# Patient Record
Sex: Female | Born: 2000 | Hispanic: Yes | Marital: Single | Smoking: Never smoker
Health system: Southern US, Community
[De-identification: ages and names within clinical notes are randomized; demographics above are authoritative.]

---
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
## 2021-01-09 DIAGNOSIS — Z1331 Encounter for screening for depression: Secondary | ICD-10-CM | POA: Diagnosis not present

## 2021-01-09 DIAGNOSIS — Z0000 Encounter for general adult medical examination without abnormal findings: Secondary | ICD-10-CM | POA: Diagnosis not present

## 2021-01-09 DIAGNOSIS — Z118 Encounter for screening for other infectious and parasitic diseases: Secondary | ICD-10-CM | POA: Diagnosis not present

## 2021-01-09 DIAGNOSIS — Z6825 Body mass index (BMI) 25.0-25.9, adult: Secondary | ICD-10-CM | POA: Diagnosis not present

## 2021-03-03 ENCOUNTER — Ambulatory Visit
Admission: RE | Admit: 2021-03-03 | Discharge: 2021-03-03 | Disposition: A | Discharge status: Home / Self Care | Payer: BC Managed Care – PPO | Source: Ambulatory Visit | Attending: Sports Medicine | Admitting: Sports Medicine

## 2021-03-03 ENCOUNTER — Other Ambulatory Visit: Payer: Self-pay

## 2021-03-03 ENCOUNTER — Ambulatory Visit (INDEPENDENT_AMBULATORY_CARE_PROVIDER_SITE_OTHER): Payer: BC Managed Care – PPO | Admitting: Sports Medicine

## 2021-03-03 VITALS — Ht <= 58 in | Wt 126.0 lb

## 2021-03-03 DIAGNOSIS — M25572 Pain in left ankle and joints of left foot: Secondary | ICD-10-CM

## 2021-03-03 NOTE — Progress Notes (Signed)
   Subjective:    Patient ID: Mariah Murphy, female    DOB: 02/05/2001, 20 y.o.   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  She is seen today for evaluation of left lateral ankle pain.  She suffered an inversion injury on 01/22/21 while training at home for a physical fitness test.  There was significant swelling and discoloration at the time of the injury that eventually subsided.  She continues to play and is able to run and walk without pain.  Mariah Murphy has been taping her ankle prior to training, practice, or games.  She is working with the Event organiser at [employer] on exercises to strengthen her left ankle.  Currently she experiences lateral ankle pain with planting on her left foot to shoot and with twisting / cutting movements.  She also experiences periodic paresthesias along the dorsum of her left foot.  She describes a baseline level of swelling around the lateral portion of her left ankle that worsens after games.  There is no history of prior injury to her left ankle.  ROS: Negative unless otherwise noted above    Objective:  Exam: Left ankle Inspection: There is mild swelling around the left lateral ankle/distal fibula Palpation: TTP over the distal fibula ROM: Normal dorsiflexion, plantarflexion, inversion, and eversion of the left foot Special testing: Slightly positive anterior drawer, negative talar tilt  [place] L Ankle (03/03/21) There is a small effusion present around the distal fibula and lateral ankle joint Peroneal tendons are intact and without evidence of effusion or injury     Assessment & Plan:   S/p left ankle sprain, rule out distal fibular fracture  Ultrasound today reveals effusion around the distal fibula and lateral ankle joint.  There is also a potential step-off along the contour of the distal fibula, concerning for possible distal fibular fracture.  Her tendons appear structurally intact and without evidence of surrounding  effusion.  Given the persistence of her pain now past one month from the date of injury, in addition to today's ultrasound findings, we discussed obtaining x-rays to rule out a distal fibula fracture.  If a fracture is identified, she would need to stop playing for several weeks to allow the fracture to heal. If xrays are negative she can continue to play, and we discussed trying a compression wrap around her ankle instead of taping. Should her pain persist or worsen and xrays are negative, we discussed that the next option would be to obtain an MRI.   Christel Mormon, MD PGY-3  Patient seen and evaluated with the resident.  I agree with the above plan of care.  X-rays show no obvious fracture.  Please note that the official radiology report was pending at the time of this dictation.  She may continue with treatment in the training room at [facility] where I will follow her progress.  If symptoms do not improve then consider MRI to rule out osteochondral injury of the ankle.

## 2021-10-21 IMAGING — DX DG ANKLE COMPLETE 3+V*L*
3 series · 3 of 3 positions shown · non-contrast
Comparison: None.

CLINICAL DATA: left ankle pain; 3 views

EXAM:
LEFT ANKLE COMPLETE - 3+ VIEW

[dg ankle complete left (1 of 3)]
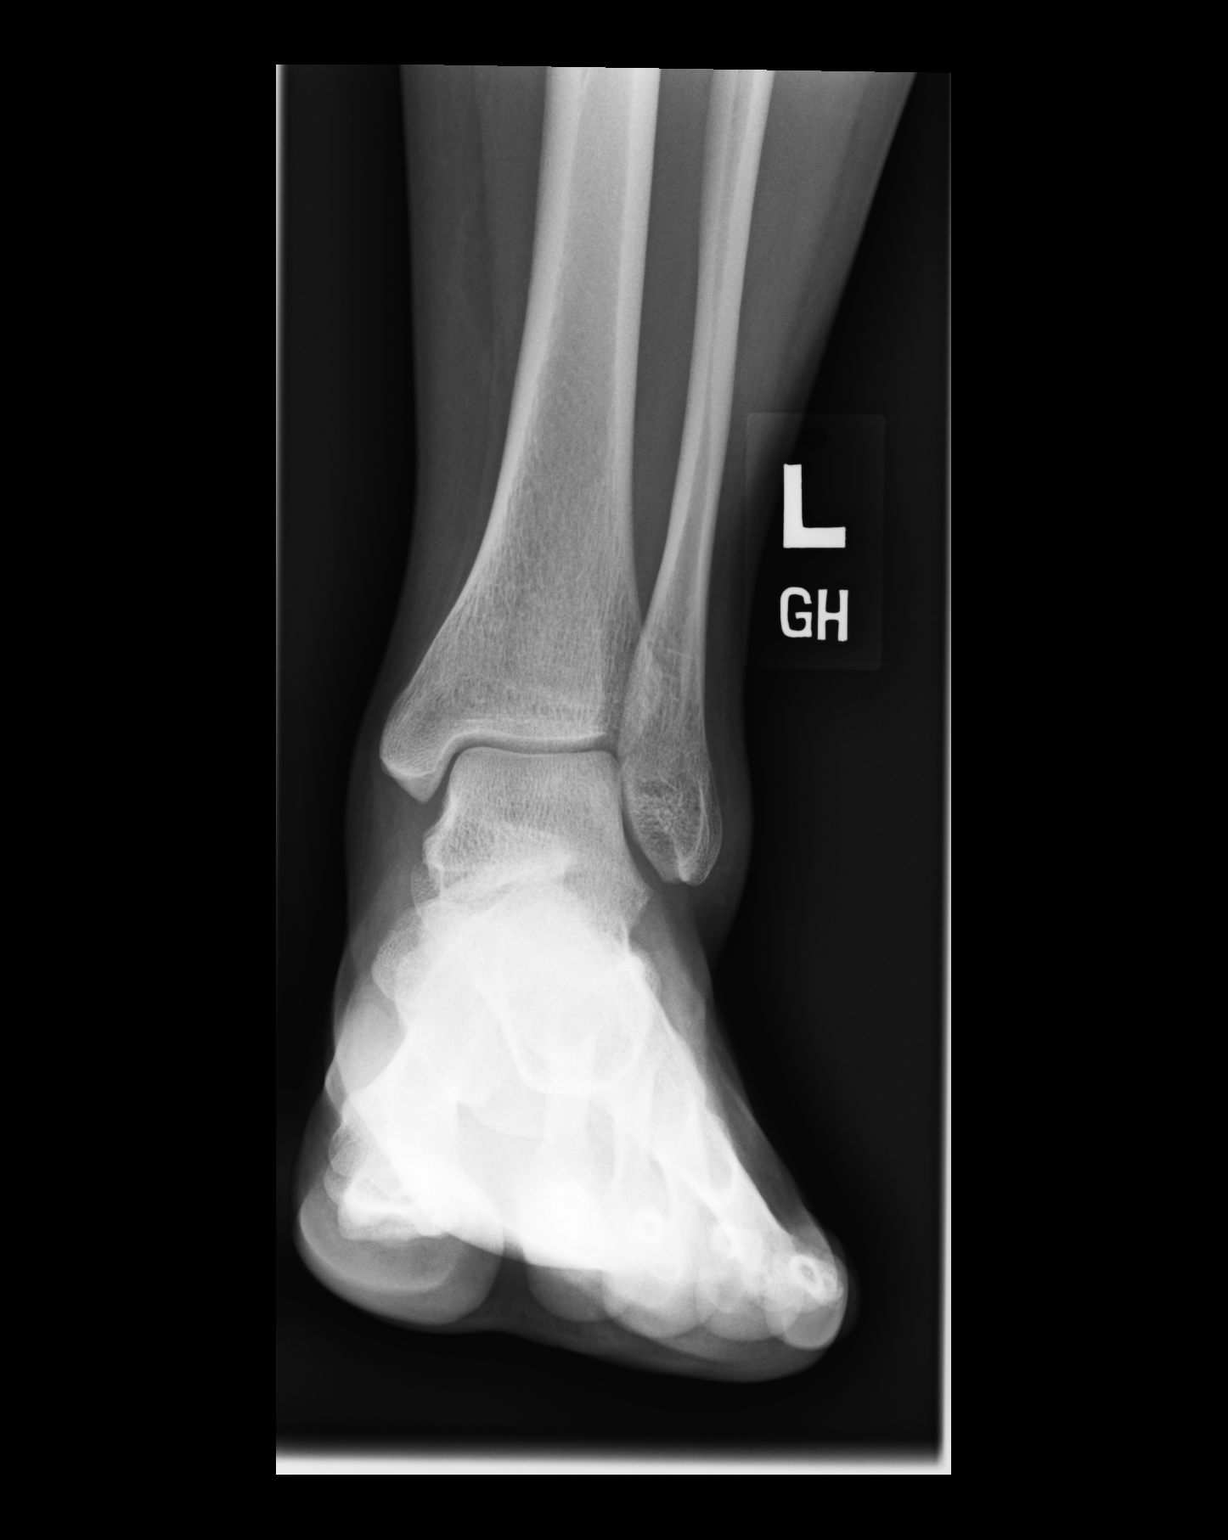

[dg ankle complete left (2 of 3)]
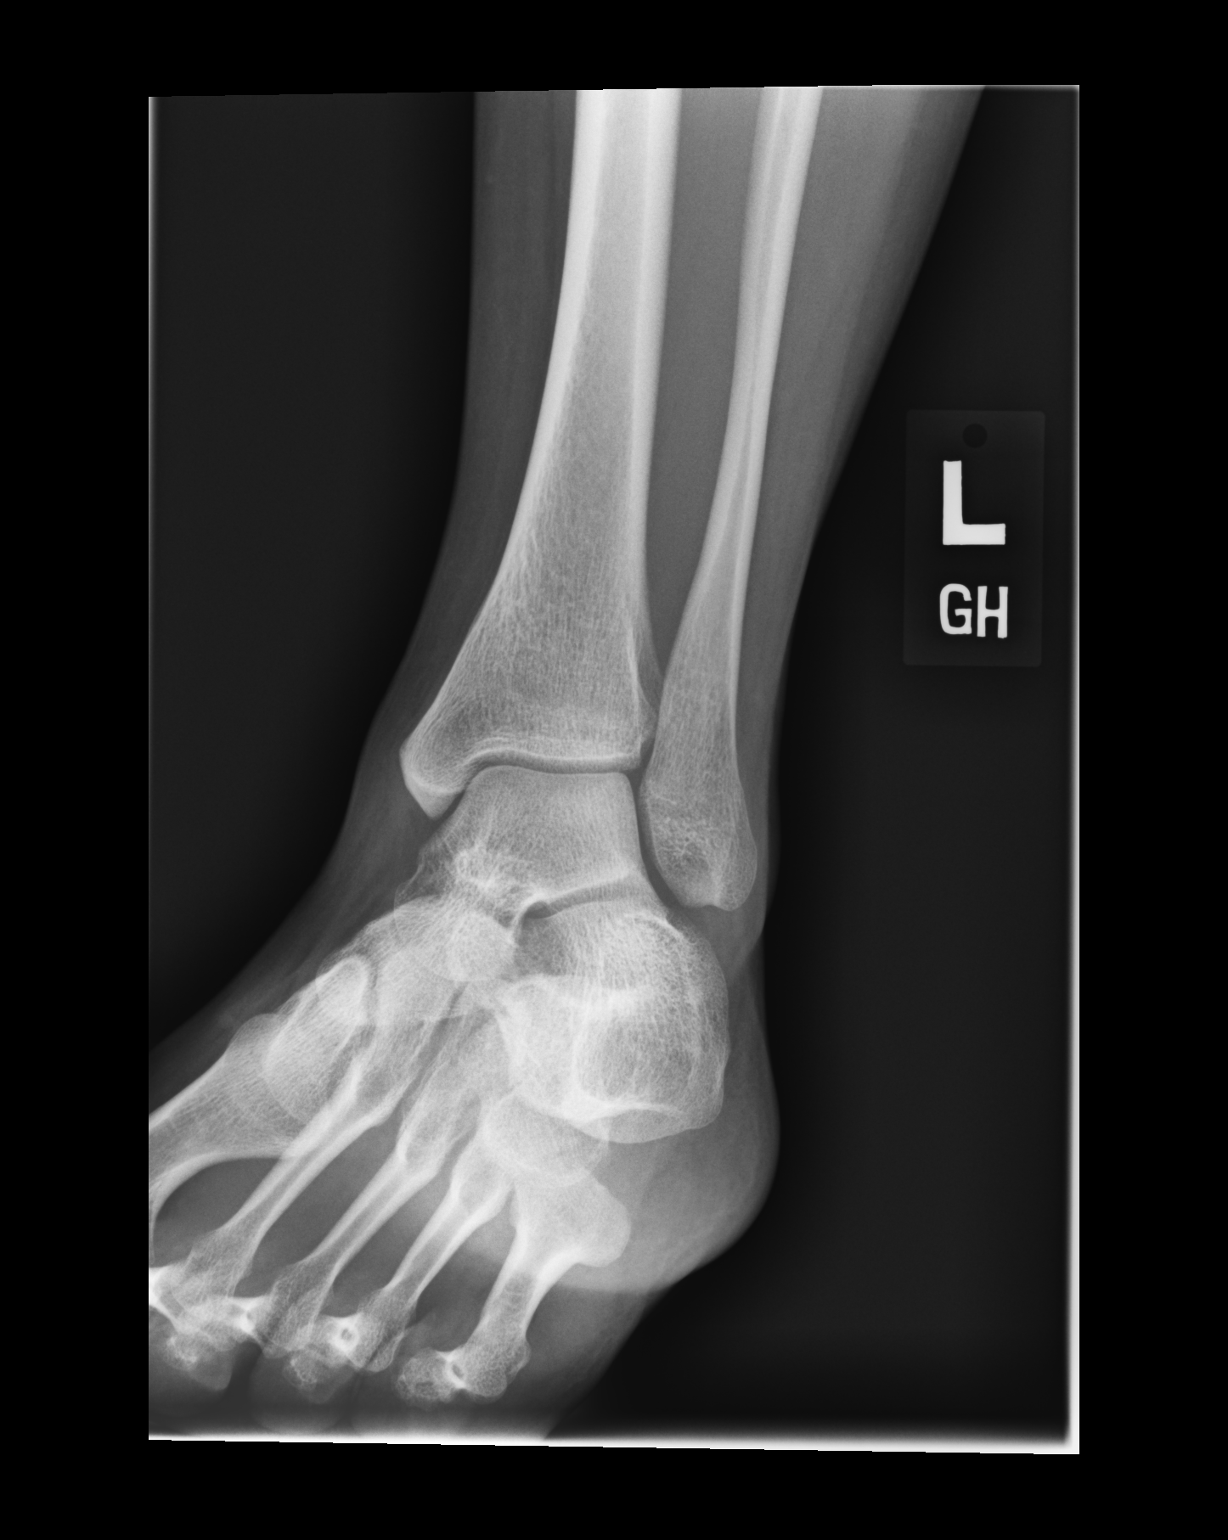

[dg ankle complete left (3 of 3)]
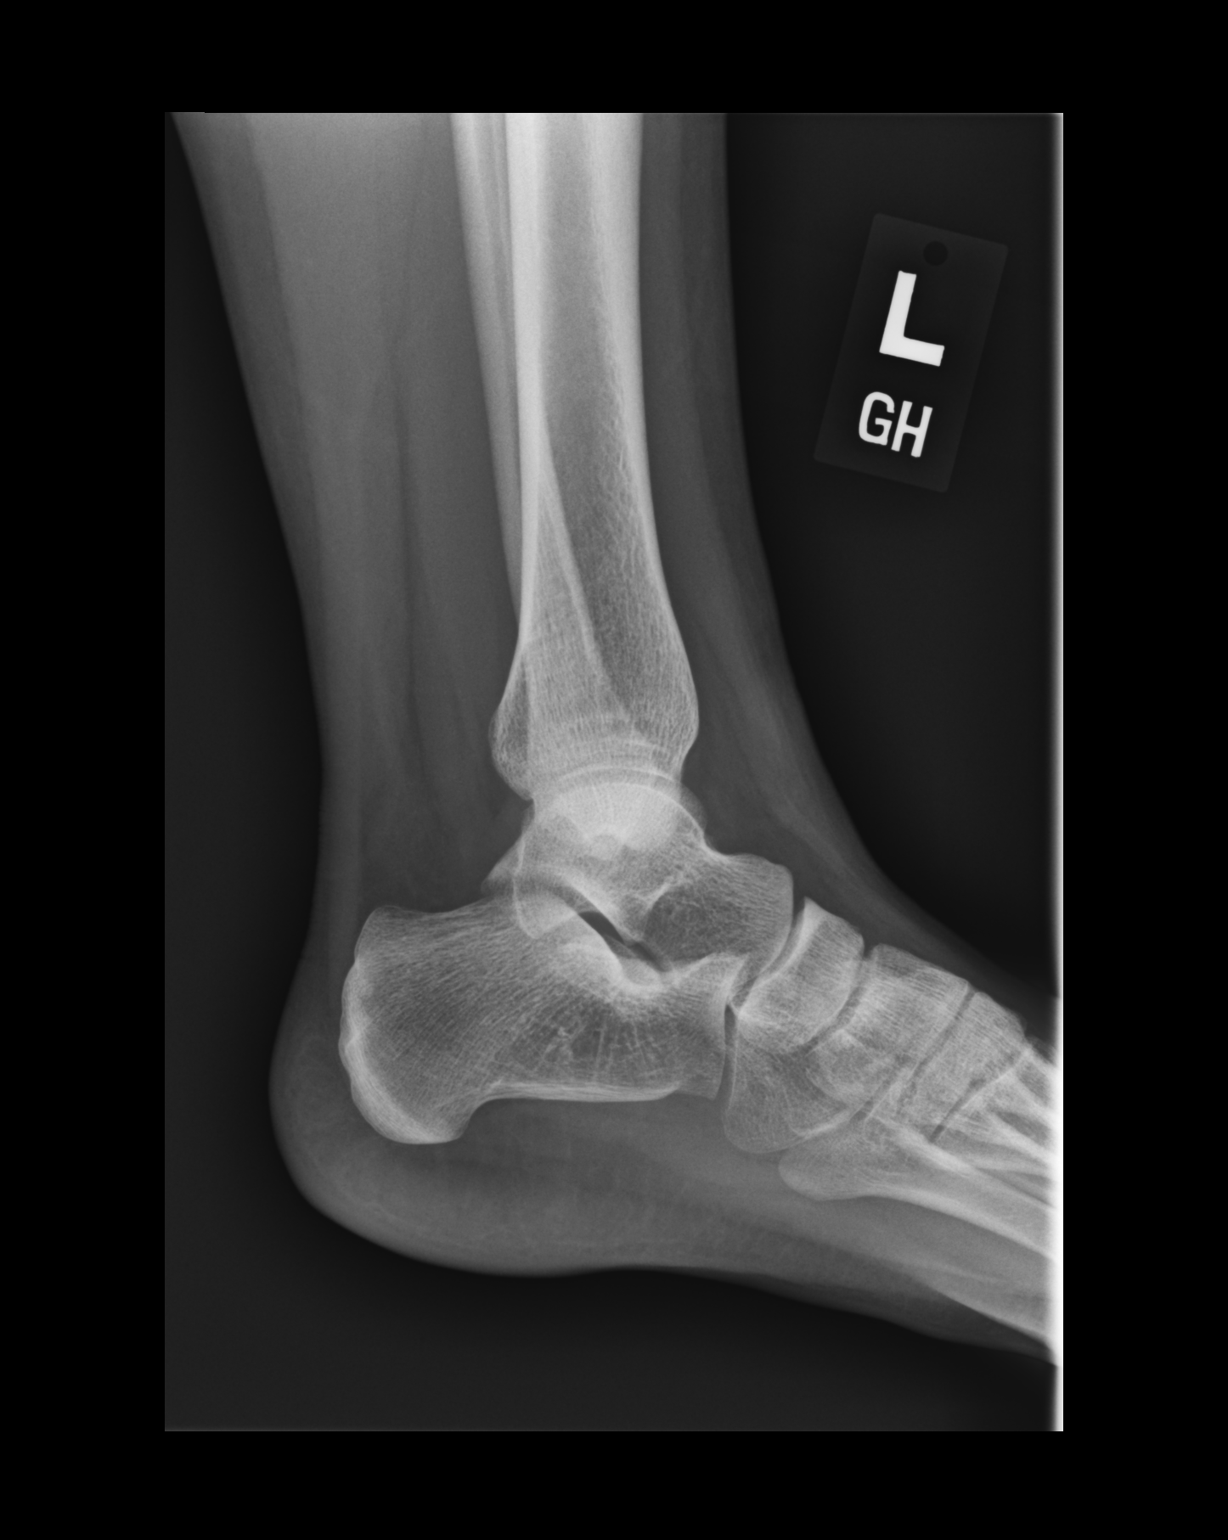

[3 of 3 positions shown; findings below may reference images not displayed]

FINDINGS: There is no evidence of acute fracture. Normal alignment. Possible
mild ankle soft tissue swelling.
IMPRESSION: No acute osseous abnormality.

## 2021-11-24 DIAGNOSIS — Z30431 Encounter for routine checking of intrauterine contraceptive device: Secondary | ICD-10-CM | POA: Diagnosis not present

## 2022-06-18 DIAGNOSIS — N760 Acute vaginitis: Secondary | ICD-10-CM | POA: Diagnosis not present

## 2022-06-18 DIAGNOSIS — N898 Other specified noninflammatory disorders of vagina: Secondary | ICD-10-CM | POA: Diagnosis not present

## 2022-06-25 DIAGNOSIS — R399 Unspecified symptoms and signs involving the genitourinary system: Secondary | ICD-10-CM | POA: Diagnosis not present

## 2022-07-01 DIAGNOSIS — R399 Unspecified symptoms and signs involving the genitourinary system: Secondary | ICD-10-CM | POA: Diagnosis not present

## 2022-07-01 DIAGNOSIS — R10814 Left lower quadrant abdominal tenderness: Secondary | ICD-10-CM | POA: Diagnosis not present

## 2022-11-26 DIAGNOSIS — Z01419 Encounter for gynecological examination (general) (routine) without abnormal findings: Secondary | ICD-10-CM | POA: Diagnosis not present

## 2022-11-26 DIAGNOSIS — Z113 Encounter for screening for infections with a predominantly sexual mode of transmission: Secondary | ICD-10-CM | POA: Diagnosis not present

## 2022-11-26 DIAGNOSIS — Z1151 Encounter for screening for human papillomavirus (HPV): Secondary | ICD-10-CM | POA: Diagnosis not present

## 2022-11-26 DIAGNOSIS — Z6824 Body mass index (BMI) 24.0-24.9, adult: Secondary | ICD-10-CM | POA: Diagnosis not present

## 2024-04-24 ENCOUNTER — Encounter (HOSPITAL_BASED_OUTPATIENT_CLINIC_OR_DEPARTMENT_OTHER): Payer: Self-pay | Admitting: Family Medicine

## 2024-04-24 ENCOUNTER — Ambulatory Visit (INDEPENDENT_AMBULATORY_CARE_PROVIDER_SITE_OTHER): Admitting: Family Medicine

## 2024-04-24 VITALS — BP 108/61 | HR 61 | Ht 58.25 in | Wt 126.0 lb

## 2024-04-24 DIAGNOSIS — Z0000 Encounter for general adult medical examination without abnormal findings: Secondary | ICD-10-CM | POA: Diagnosis not present

## 2024-04-24 DIAGNOSIS — Z7689 Persons encountering health services in other specified circumstances: Secondary | ICD-10-CM

## 2024-04-24 DIAGNOSIS — Z975 Presence of (intrauterine) contraceptive device: Secondary | ICD-10-CM

## 2024-04-24 NOTE — Progress Notes (Signed)
 Subjective:   Mariah Murphy 2001/05/04  04/24/2024   CC: Chief Complaint  Patient presents with   New Patient (Initial Visit)    Patient is here today to get established with the practice. Denies any main concerns for today's visit.    HPI: Mariah Murphy is a 23 y.o. female who presents to establish care and for a routine health maintenance exam.     IUD MAINTENANCE:  Patient states she had her IUD placed in July 2021 by OBGYN in [place], [place]. Will obtain records from [facility] for placement and pap smear per patient. She states she will occasionally get UTI and vaginal yeast infections due to positioning of IUD and patient's height/body mass per OBGYN.    HEALTH SCREENINGS: - Vision Screening: not applicable - Dental Visits: up to date - Pap smear: Completed with Bridget[facility] SHIPPER- will obtain records - Breast Exam: Declined - STD Screening: Declined - Mammogram (40+): Not applicable  - Colonoscopy (45+): Not applicable  - Bone Density (65+ or under 65 with predisposing conditions): Not applicable  - Lung CA screening with low-dose CT:  Not applicable Adults age 11-80 who are current cigarette smokers or quit within the last 15 years. Must have 20 pack year history.   Depression and Anxiety Screen done today and results listed below:     04/24/2024    3:26 PM  Depression screen PHQ 2/9  Decreased Interest 0  Down, Depressed, Hopeless 0  PHQ - 2 Score 0  Altered sleeping 0  Tired, decreased energy 0  Change in appetite 0  Feeling bad or failure about yourself  0  Trouble concentrating 0  Moving slowly or fidgety/restless 0  Suicidal thoughts 0  PHQ-9 Score 0  Difficult doing work/chores Not difficult at all      04/24/2024    3:26 PM  GAD 7 : Generalized Anxiety Score  Nervous, Anxious, on Edge 0  Control/stop worrying 0  Worry too much - different things 0  Trouble relaxing 0  Restless 0  Easily annoyed or irritable 0  Afraid - awful might  happen 0  Total GAD 7 Score 0  Anxiety Difficulty Not difficult at all    IMMUNIZATIONS: - Tdap: Tetanus vaccination status reviewed: Recommended, patient declined. - HPV: Up to date - Influenza: Refused - Pneumovax: Not applicable - Prevnar 20: Not applicable - Shingrix (50+): Not applicable   Past medical history, surgical history, medications, allergies, family history and social history reviewed with patient today and changes made to appropriate areas of the chart.   History reviewed. No pertinent past medical history.  History reviewed. No pertinent surgical history.  Current Outpatient Medications on File Prior to Visit  Medication Sig   levonorgestrel (KYLEENA) 19.5 MG IUD 1 each by Intrauterine route once. Placed on July 30th, 2021 per patietn with OBGYN   No current facility-administered medications on file prior to visit.    No Known Allergies   Social History   Socioeconomic History   Marital status: Single    Spouse name: Not on file   Number of children: Not on file   Years of education: Not on file   Highest education level: Not on file  Occupational History   Not on file  Tobacco Use   Smoking status: Never   Smokeless tobacco: Never  Vaping Use   Vaping status: Never Used  Substance and Sexual Activity   Alcohol use: Never   Drug use: Never   Sexual activity: Yes  Birth control/protection: I.U.D.  Other Topics Concern   Not on file  Social History Narrative   Not on file   Social Drivers of Health   Financial Resource Strain: Not on file  Food Insecurity: Not on file  Transportation Needs: Not on file  Physical Activity: Not on file  Stress: Not on file  Social Connections: Not on file  Intimate Partner Violence: Not on file   Social History   Tobacco Use  Smoking Status Never  Smokeless Tobacco Never   Social History   Substance and Sexual Activity  Alcohol Use Never    Family History  Adopted: Yes     ROS: Denies  fever, fatigue, unexplained weight loss/gain, chest pain, SHOB, and palpitations. Denies neurological deficits, gastrointestinal or genitourinary complaints, and skin changes.   Objective:   Today's Vitals   04/24/24 1522  BP: 108/61  Pulse: 61  SpO2: 100%  Weight: 126 lb (57.2 kg)  Height: 4' 10.25 (1.48 m)    GENERAL APPEARANCE: Well-appearing, in NAD. Well nourished.  SKIN: Pink, warm and dry. Turgor normal. No rash, lesion, ulceration, or ecchymoses. Hair evenly distributed.  HEENT: HEAD: Normocephalic.  EYES: PERRLA. EOMI. Lids intact w/o defect. Sclera white, Conjunctiva pink w/o exudate.  EARS: External ear w/o redness, swelling, masses or lesions. EAC clear. TM's intact, translucent w/o bulging, appropriate landmarks visualized. Appropriate acuity to conversational tones.  NOSE: Septum midline w/o deformity. Nares patent, mucosa pink and non-inflamed w/o drainage. No sinus tenderness.  THROAT: Uvula midline. Oropharynx clear. Tonsils non-inflamed w/o exudate. Oral mucosa pink and moist.  NECK: Supple, Trachea midline. Full ROM w/o pain or tenderness. No lymphadenopathy. Thyroid non-tender w/o enlargement or palpable masses.  RESPIRATORY: Chest wall symmetrical w/o masses. Respirations even and non-labored. Breath sounds clear to auscultation bilaterally. No wheezes, rales, rhonchi, or crackles. CARDIAC: S1, S2 present, regular rate and rhythm. No gallops, murmurs, rubs, or clicks. PMI w/o lifts, heaves, or thrills. No carotid bruits. Capillary refill <2 seconds. Peripheral pulses 2+ bilaterally. GI: Abdomen soft w/o distention. Normoactive bowel sounds. No palpable masses or tenderness. No guarding or rebound tenderness. Liver and spleen w/o tenderness or enlargement. No CVA tenderness.  MSK: Muscle tone and strength appropriate for age, w/o atrophy or abnormal movement.  EXTREMITIES: Active ROM intact, w/o tenderness, crepitus, or contracture. No obvious joint deformities or  effusions. No clubbing, edema, or cyanosis.  NEUROLOGIC: CN's II-XII intact. Motor strength symmetrical with no obvious weakness. No sensory deficits. DTR's 2+ symmetric bilaterally. Steady, even gait.  PSYCH/MENTAL STATUS: Alert, oriented x 3. Cooperative, appropriate mood and affect.    No results found for this or any previous visit.  Assessment & Plan:  1. Encounter to establish care with new doctor (Primary) Discussed role of PCP and reviewed medical history.   2. Annual physical exam Discussed preventative screenings, vaccines, and healthy lifestyle with patient. Patient will return for fasting labs when able. Recommend Flu and Tdap vaccines for patient.  - CBC with Differential/Platelet; Future - Comprehensive metabolic panel with GFR; Future - Lipid panel; Future  3. IUD (intrauterine device) in place Discussed IUD in place and will need to be replaced by OBGYN when due. Patient aware and will obtain OBGYN. Declined referral need today.    Orders Placed This Encounter  Procedures   CBC with Differential/Platelet    Standing Status:   Future    Expiration Date:   07/29/2024   Comprehensive metabolic panel with GFR    Standing Status:   Future  Expiration Date:   07/29/2024   Lipid panel    Standing Status:   Future    Expiration Date:   07/29/2024    PATIENT COUNSELING:  - Encouraged a healthy well-balanced diet. Patient may adjust caloric intake to maintain or achieve ideal body weight. May reduce intake of dietary saturated fat and total fat and have adequate dietary potassium and calcium preferably from fresh fruits, vegetables, and low-fat dairy products.   - Advised to avoid cigarette smoking. - Discussed with the patient that most people either abstain from alcohol or drink within safe limits (<=14/week and <=4 drinks/occasion for males, <=7/weeks and <= 3 drinks/occasion for females) and that the risk for alcohol disorders and other health effects rises proportionally  with the number of drinks per week and how often a drinker exceeds daily limits. - Discussed cessation/primary prevention of drug use and availability of treatment for abuse.  - Discussed sexually transmitted diseases, avoidance of unintended pregnancy and contraceptive alternatives.  - Stressed the importance of regular exercise - Injury prevention: Discussed safety belts, safety helmets, smoke detector, smoking near bedding or upholstery.  - Dental health: Discussed importance of regular tooth brushing, flossing, and dental visits.   NEXT PREVENTATIVE PHYSICAL DUE IN 1 YEAR.  Return in about 1 year (around 04/24/2025) for ANNUAL PHYSICAL.  Patient to reach out to office if new, worrisome, or unresolved symptoms arise or if no improvement in patient's condition. Patient verbalized understanding and is agreeable to treatment plan. All questions answered to patient's satisfaction.    Thersia Schuyler Stark, [place]

## 2024-04-24 NOTE — Patient Instructions (Addendum)
 Please return for fasting blood work.  For fasting, if your blood work is in the morning please do not eat any food after midnight.  You may have water or black coffee prior to your lab work.  Please take all regularly prescribed medications even if you are fasting.  If your blood work is in the afternoon, please fast for at least 5 to 6 hours.  You may continue to drink water and/or black coffee prior to your lab work.  Please take all scheduled medications even if you are fasting.   Things to do to keep yourself healthy: - Exercise at least 30-45 minutes a day, 3-4 days a week.  - Eat a low-fat diet with lots of fruits and vegetables, up to 7-9 servings per day.  - Seatbelts can save your life. Wear them always.  - Smoke detectors on every level of your home, check batteries every year.  - Eye Doctor - have an eye exam every 1-2 years  - Safe sex - if you may be exposed to STDs, use a condom.  - No smoking, vaping, or use of any tobacco products.  - Alcohol  -  If you drink, do it moderately, less than 2 drinks per day.  - No illegal drug use.  - Depression is common in our stressful world.If you're feeling down or losing interest in things you normally enjoy, please come in for a visit.  - Violence - If anyone is threatening or hurting you, please call immediately.

## 2024-05-16 ENCOUNTER — Encounter (HOSPITAL_BASED_OUTPATIENT_CLINIC_OR_DEPARTMENT_OTHER): Payer: Self-pay | Admitting: Family Medicine

## 2024-05-17 NOTE — Telephone Encounter (Signed)
 Please see mychart message sent by pt and advise.

## 2024-05-21 ENCOUNTER — Other Ambulatory Visit (HOSPITAL_BASED_OUTPATIENT_CLINIC_OR_DEPARTMENT_OTHER): Payer: Self-pay | Admitting: Family Medicine

## 2024-05-21 DIAGNOSIS — R61 Generalized hyperhidrosis: Secondary | ICD-10-CM

## 2024-05-28 ENCOUNTER — Other Ambulatory Visit (HOSPITAL_BASED_OUTPATIENT_CLINIC_OR_DEPARTMENT_OTHER): Payer: Self-pay | Admitting: Family Medicine

## 2024-05-28 DIAGNOSIS — Z0000 Encounter for general adult medical examination without abnormal findings: Secondary | ICD-10-CM

## 2024-05-28 DIAGNOSIS — R61 Generalized hyperhidrosis: Secondary | ICD-10-CM

## 2024-05-29 LAB — COMPREHENSIVE METABOLIC PANEL WITH GFR
ALT: 13 IU/L (ref 0–32)
AST: 19 IU/L (ref 0–40)
Albumin: 5.2 g/dL — ABNORMAL HIGH (ref 4.0–5.0)
Alkaline Phosphatase: 83 IU/L (ref 41–116)
BUN/Creatinine Ratio: 14 (ref 9–23)
BUN: 12 mg/dL (ref 6–20)
Bilirubin Total: 0.7 mg/dL (ref 0.0–1.2)
CO2: 21 mmol/L (ref 20–29)
Calcium: 10.6 mg/dL — ABNORMAL HIGH (ref 8.7–10.2)
Chloride: 102 mmol/L (ref 96–106)
Creatinine, Ser: 0.88 mg/dL (ref 0.57–1.00)
Globulin, Total: 2.5 g/dL (ref 1.5–4.5)
Glucose: 86 mg/dL (ref 70–99)
Potassium: 5.3 mmol/L — ABNORMAL HIGH (ref 3.5–5.2)
Sodium: 140 mmol/L (ref 134–144)
Total Protein: 7.7 g/dL (ref 6.0–8.5)
eGFR: 95 mL/min/1.73 (ref >59)

## 2024-05-29 LAB — TSH RFX ON ABNORMAL TO FREE T4: TSH: 1.95 u[IU]/mL (ref 0.450–4.500)

## 2024-05-29 LAB — CBC WITH DIFFERENTIAL/PLATELET
Basophils Absolute: 0.1 x10E3/uL (ref 0.0–0.2)
Basos: 1 %
EOS (ABSOLUTE): 0 x10E3/uL (ref 0.0–0.4)
Eos: 0 %
Hematocrit: 42.6 % (ref 34.0–46.6)
Hemoglobin: 13.9 g/dL (ref 11.1–15.9)
Immature Grans (Abs): 0 x10E3/uL (ref 0.0–0.1)
Immature Granulocytes: 0 %
Lymphocytes Absolute: 2.4 x10E3/uL (ref 0.7–3.1)
Lymphs: 28 %
MCH: 29.1 pg (ref 26.6–33.0)
MCHC: 32.6 g/dL (ref 31.5–35.7)
MCV: 89 fL (ref 79–97)
Monocytes Absolute: 0.4 x10E3/uL (ref 0.1–0.9)
Monocytes: 5 %
Neutrophils Absolute: 5.6 x10E3/uL (ref 1.4–7.0)
Neutrophils: 66 %
Platelets: 358 x10E3/uL (ref 150–450)
RBC: 4.78 x10E6/uL (ref 3.77–5.28)
RDW: 12 % (ref 11.7–15.4)
WBC: 8.6 x10E3/uL (ref 3.4–10.8)

## 2024-05-29 LAB — LIPID PANEL
Chol/HDL Ratio: 2.6 ratio (ref 0.0–4.4)
Cholesterol, Total: 144 mg/dL (ref 100–199)
HDL: 56 mg/dL (ref >39)
LDL Chol Calc (NIH): 75 mg/dL (ref 0–99)
Triglycerides: 61 mg/dL (ref 0–149)
VLDL Cholesterol Cal: 13 mg/dL (ref 5–40)

## 2024-05-31 ENCOUNTER — Ambulatory Visit (HOSPITAL_BASED_OUTPATIENT_CLINIC_OR_DEPARTMENT_OTHER): Payer: Self-pay | Admitting: Family Medicine

## 2024-05-31 NOTE — Progress Notes (Signed)
 Mariah Murphy,  Your potassium and calcium were slightly elevated.  I would like you to increase your clear fluid intake and we can recheck this in 2 to 3 weeks.  Your thyroid function, lipid panel and blood counts are normal.  If you are agreeable to the recheck please let us  know.

## 2024-06-06 NOTE — Telephone Encounter (Signed)
 Please see mychart message sent by pt and advise.

## 2024-06-07 ENCOUNTER — Other Ambulatory Visit (HOSPITAL_BASED_OUTPATIENT_CLINIC_OR_DEPARTMENT_OTHER): Payer: Self-pay | Admitting: Family Medicine

## 2024-06-07 DIAGNOSIS — E875 Hyperkalemia: Secondary | ICD-10-CM

## 2024-06-07 NOTE — Telephone Encounter (Unsigned)
 Copied from CRM #[id number]. Topic: General - Call Back - No Documentation >> Jun 07, 2024  4:02 PM Joesph B wrote: Reason for CRM: patient states she received a call from a nurse.

## 2024-06-07 NOTE — Telephone Encounter (Signed)
 Can we try to get pt scheduled for a lab appt in 1-2 weeks for repeat BMP?

## 2024-06-11 ENCOUNTER — Encounter (HOSPITAL_BASED_OUTPATIENT_CLINIC_OR_DEPARTMENT_OTHER): Payer: Self-pay

## 2024-06-11 ENCOUNTER — Ambulatory Visit (HOSPITAL_BASED_OUTPATIENT_CLINIC_OR_DEPARTMENT_OTHER)

## 2024-06-11 ENCOUNTER — Other Ambulatory Visit (HOSPITAL_BASED_OUTPATIENT_CLINIC_OR_DEPARTMENT_OTHER): Payer: Self-pay | Admitting: Family Medicine

## 2024-06-12 ENCOUNTER — Ambulatory Visit (HOSPITAL_BASED_OUTPATIENT_CLINIC_OR_DEPARTMENT_OTHER): Payer: Self-pay | Admitting: Family Medicine

## 2024-06-12 LAB — BASIC METABOLIC PANEL WITH GFR
BUN/Creatinine Ratio: 12 (ref 9–23)
BUN: 8 mg/dL (ref 6–20)
CO2: 20 mmol/L (ref 20–29)
Calcium: 9.9 mg/dL (ref 8.7–10.2)
Chloride: 104 mmol/L (ref 96–106)
Creatinine, Ser: 0.69 mg/dL (ref 0.57–1.00)
Glucose: 76 mg/dL (ref 70–99)
Potassium: 5.1 mmol/L (ref 3.5–5.2)
Sodium: 143 mmol/L (ref 134–144)
eGFR: 126 mL/min/1.73 (ref >59)

## 2024-06-12 NOTE — Progress Notes (Signed)
 Labs are within normal limits. Please continue good hydration daily.

## 2024-07-25 ENCOUNTER — Ambulatory Visit (HOSPITAL_BASED_OUTPATIENT_CLINIC_OR_DEPARTMENT_OTHER): Admitting: Family Medicine

## 2024-07-25 ENCOUNTER — Encounter (HOSPITAL_BASED_OUTPATIENT_CLINIC_OR_DEPARTMENT_OTHER): Payer: Self-pay | Admitting: Family Medicine

## 2024-07-25 VITALS — BP 122/81 | HR 85 | Ht 58.25 in | Wt 119.7 lb

## 2024-07-25 DIAGNOSIS — N3000 Acute cystitis without hematuria: Secondary | ICD-10-CM

## 2024-07-25 LAB — POCT URINALYSIS DIPSTICK (MANUAL)
Nitrite, UA: NEGATIVE
Poct Bilirubin: NEGATIVE
Poct Blood: NEGATIVE
Poct Glucose: NORMAL mg/dL
Poct Ketones: NEGATIVE
Poct Protein: NEGATIVE mg/dL
Poct Urobilinogen: NORMAL mg/dL
Spec Grav, UA: 1.01
pH, UA: 7

## 2024-07-25 MED ORDER — NITROFURANTOIN MONOHYD MACRO 100 MG PO CAPS: 100.0000 mg | ORAL_CAPSULE | Freq: Two times a day (BID) | ORAL | 0 refills | Status: AC

## 2024-07-25 NOTE — Progress Notes (Signed)
 "     Acute Care Office Visit  Subjective:   Mariah Murphy 04-28-2001 07/25/2024  Chief Complaint  Patient presents with   Urinary Tract Infection    Pt has noticed some cloudiness in her urine. Has had some frequency but denies any pain.    HPI: URINARY SYMPTOMS Onset: 1 day ago   Patient reports hx of UTI in the past. Reports new onset of cloudy urine, mildly increased urinary frequency but she has increased oral intake due to cold weather. Feels like she might have some urinary retention at times for the past 2 voids.   Fever/chills: no Dysuria: no Urinary frequency: yes Urgency: no Foul odor: no Urinary incontinence: no Hematuria: no Abdominal pain: no Suprapubic pain/pressure: no Flank/low back pain: no Nausea/Vomiting: no  Treatments tried: None  Previous urinary tract infection: yes Recurrent urinary tract infection: no History of sexually transmitted disease: no , denies need for testing currently     The following portions of the patient's history were reviewed and updated as appropriate: past medical history, past surgical history, family history, social history, allergies, medications, and problem list.   Patient Active Problem List   Diagnosis Date Noted   IUD (intrauterine device) in place 04/24/2024   History reviewed. No pertinent past medical history. History reviewed. No pertinent surgical history. Family History  Adopted: Yes   Outpatient Medications Prior to Visit  Medication Sig Dispense Refill   levonorgestrel (KYLEENA) 19.5 MG IUD 1 each by Intrauterine route once. Placed on July 30th, 2021 per patietn with OBGYN     No facility-administered medications prior to visit.   Allergies[1]   ROS: A complete ROS was performed with pertinent positives/negatives noted in the HPI. The remainder of the ROS are negative.    Objective:   Today's Vitals   07/25/24 0943  BP: 122/81  Pulse: 85  SpO2: 98%  Weight: 119 lb 11.2 oz (54.3  kg)  Height: 4' 10.25 (1.48 m)    GENERAL: Well-appearing, in NAD. Well nourished.  SKIN: Pink, warm and dry.  Head: Normocephalic. NECK: Trachea midline. Full ROM w/o pain or tenderness.  RESPIRATORY: Chest wall symmetrical. Respirations even and non-labored.  MSK: Muscle tone and strength appropriate for age.  GI: Abdomen soft, non-tender. Normoactive bowel sounds. No rebound tenderness. No hepatomegaly or splenomegaly. No CVA tenderness.  NEUROLOGIC: No motor or sensory deficits. Steady, even gait. C2-C12 intact.  PSYCH/MENTAL STATUS: Alert, oriented x 3. Cooperative, appropriate mood and affect.    Results for orders placed or performed in visit on 07/25/24  POCT Urinalysis Dip Manual  Result Value Ref Range   Spec Grav, UA 1.010 1.010 - 1.025   pH, UA 7.0 5.0 - 8.0   Leukocytes, UA Small (1+) (A) Negative   Nitrite, UA Negative Negative   Poct Protein Negative Negative, trace mg/dL   Poct Glucose Normal Normal mg/dL   Poct Ketones Negative Negative   Poct Urobilinogen Normal Normal mg/dL   Poct Bilirubin Negative Negative   Poct Blood Negative Negative, trace      Assessment & Plan:  1. Acute cystitis without hematuria (Primary) +Leukocytes. Will start Macrobid  to treat empirically and send urine for culture. Increase clear fluids. If no improvement or worsening in 48 hours, reach out to PCP.  - nitrofurantoin , macrocrystal-monohydrate, (MACROBID ) 100 MG capsule; Take 1 capsule (100 mg total) by mouth 2 (two) times daily.  Dispense: 10 capsule; Refill: 0 - Urine Culture - POCT Urinalysis Dip Manual   Meds ordered this  encounter  Medications   nitrofurantoin , macrocrystal-monohydrate, (MACROBID ) 100 MG capsule    Sig: Take 1 capsule (100 mg total) by mouth 2 (two) times daily.    Dispense:  10 capsule    Refill:  0    Supervising Provider:   DE CUBA, RAYMOND J [[id number]]   Lab Orders         Urine Culture         POCT Urinalysis Dip Manual     No images are  attached to the encounter or orders placed in the encounter.  Return if symptoms worsen or fail to improve.    Patient to reach out to office if new, worrisome, or unresolved symptoms arise or if no improvement in patient's condition. Patient verbalized understanding and is agreeable to treatment plan. All questions answered to patient's satisfaction.    Mariah Schuyler Stark, FNP      [1] No Known Allergies  "

## 2024-07-28 LAB — URINE CULTURE

## 2024-07-30 ENCOUNTER — Ambulatory Visit (HOSPITAL_BASED_OUTPATIENT_CLINIC_OR_DEPARTMENT_OTHER): Payer: Self-pay | Admitting: Family Medicine

## 2024-07-30 NOTE — Progress Notes (Signed)
 Culture reviewed, No change to regimen.
